# Patient Record
Sex: Female | Born: 1995 | Race: White | Hispanic: No | Marital: Single | Smoking: Never smoker
Health system: Southern US, Community
[De-identification: ages and names within clinical notes are randomized; demographics above are authoritative.]

## PROBLEM LIST (undated history)

## (undated) ENCOUNTER — Inpatient Hospital Stay (HOSPITAL_COMMUNITY): Payer: Self-pay

---
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
## 2020-06-28 ENCOUNTER — Emergency Department (HOSPITAL_COMMUNITY)
Admission: EM | Admit: 2020-06-28 | Discharge: 2020-06-29 | Disposition: A | Discharge status: Home / Self Care | Payer: Medicaid Other | Attending: Emergency Medicine | Admitting: Emergency Medicine

## 2020-06-28 ENCOUNTER — Other Ambulatory Visit: Payer: Self-pay

## 2020-06-28 ENCOUNTER — Encounter (HOSPITAL_COMMUNITY): Payer: Self-pay | Admitting: Emergency Medicine

## 2020-06-28 DIAGNOSIS — U071 COVID-19: Secondary | ICD-10-CM | POA: Insufficient documentation

## 2020-06-28 DIAGNOSIS — R059 Cough, unspecified: Secondary | ICD-10-CM | POA: Diagnosis present

## 2020-06-28 DIAGNOSIS — B349 Viral infection, unspecified: Secondary | ICD-10-CM

## 2020-06-28 DIAGNOSIS — G4483 Primary cough headache: Secondary | ICD-10-CM

## 2020-06-28 LAB — URINALYSIS, ROUTINE W REFLEX MICROSCOPIC
Bilirubin Urine: NEGATIVE
Glucose, UA: NEGATIVE mg/dL
Hgb urine dipstick: NEGATIVE
Ketones, ur: 20 mg/dL — AB
Leukocytes,Ua: NEGATIVE
Nitrite: NEGATIVE
Protein, ur: NEGATIVE mg/dL
Specific Gravity, Urine: 1.013 (ref 1.005–1.030)
pH: 6 (ref 5.0–8.0)

## 2020-06-28 NOTE — ED Provider Notes (Addendum)
Emergency Medicine Provider Triage Evaluation Note  Kristen Blankenship , a 25 y.o. female  was evaluated in triage.  Pt complains of headache, chills for the past few days.  Reports vomiting but states that this is typical as she is about [redacted] weeks pregnant.  Denies any abdominal pain, vaginal bleeding, discharge, chest pain.  Also reports back pain.  Review of Systems  Positive: Pain, headache, chills Negative: Abdominal pain, vaginal bleeding  Physical Exam  BP 99/70 (BP Location: Right Arm)   Pulse (!) 105   Temp 99.5 F (37.5 C) (Oral)   Resp 16   Ht 5\' 5"  (1.651 m)   Wt 104.3 kg   SpO2 98%   BMI 38.27 kg/m  Gen:   Awake, no distress   Resp:  Normal effort  MSK:   Moves extremities without difficulty  Other:  Abd soft  Medical Decision Making  Medically screening exam initiated at 7:32 PM.  Appropriate orders placed.  Kristen Blankenship was informed that the remainder of the evaluation will be completed by another provider, this initial triage assessment does not replace that evaluation, and the importance of remaining in the ED until their evaluation is complete.  UA and covid test ordered   Spoke to MAU provider they prefer we take care of these known pregnancy related concerns here in the ER    York Pellant, PA-C 06/28/20 2021    [place], 2022, MD 06/28/20 2036

## 2020-06-28 NOTE — ED Triage Notes (Signed)
Patient reports feeling tired , fatigue , gen. body aches , poor appetite with chills onset this week . Denies vaginal bleeding or contractions .

## 2020-06-29 LAB — RESP PANEL BY RT-PCR (FLU A&B, COVID) ARPGX2
Influenza A by PCR: NEGATIVE
Influenza B by PCR: NEGATIVE
SARS Coronavirus 2 by RT PCR: POSITIVE — AB

## 2020-06-29 MED ORDER — ACETAMINOPHEN 500 MG PO TABS
1000.0000 mg | ORAL_TABLET | Freq: Once | ORAL | Status: AC
  Administered 2020-06-29: 1000 mg via ORAL
  Filled 2020-06-29: qty 2

## 2020-06-29 MED ORDER — METOCLOPRAMIDE HCL 10 MG PO TABS: 10.0000 mg | ORAL_TABLET | Freq: Four times a day (QID) | ORAL | 0 refills | Status: AC | PRN

## 2020-06-29 MED ORDER — ACETAMINOPHEN 325 MG PO TABS: 325.0000 mg | ORAL_TABLET | Freq: Once | ORAL | Status: DC

## 2020-06-29 NOTE — Discharge Instructions (Signed)
Take tylenol 2 pills 4 times a day.  Drink plenty of fluids.  Return for worsening shortness of breath, headache, confusion. Follow up with your family doctor.     

## 2020-06-29 NOTE — ED Provider Notes (Signed)
[facility] EMERGENCY DEPARTMENT Provider Note   CSN: [id number] Arrival date & time: 06/28/20  1902     History Chief Complaint  Patient presents with  . 14 weeks G2P1: Migraine /Fatigue    Kristen Blankenship is a 25 y.o. female.  25 yo F with a chief complaints of cough congestion fevers chills myalgias nausea vomiting and a headache.  This been going on for a few days.  Patient has just 1 to lay around and has really wanted to do anything.  Not really wanting to eat or drink.  Has taken Tylenol with improvement of her symptoms but this it is the medicine wears off she feels bad again.  No known sick contacts.  No vaginal bleeding or discharge.  No abdominal pain.  She is about [redacted] weeks pregnant.  The history is provided by the patient.  Illness Severity:  Moderate Onset quality:  Gradual Duration:  3 days Timing:  Constant Progression:  Worsening Chronicity:  New Associated symptoms: congestion, cough, fever, headaches, nausea and vomiting   Associated symptoms: no abdominal pain, no chest pain, no diarrhea, no myalgias, no rhinorrhea, no shortness of breath and no wheezing        History reviewed. No pertinent past medical history.  Patient Active Problem List   Diagnosis Date Noted  . Risky sexual behavior 07/18/2013  . Substance use disorder 07/18/2013  . Depression 07/18/2013    History reviewed. No pertinent surgical history.   OB History    Gravida  2   Para  0   Term  0   Preterm  0   AB  0   Living        SAB  0   IAB  0   Ectopic  0   Multiple      Live Births              Family History  Problem Relation Age of Onset  . Cancer Other   . Hypertension Other   . Seizures Other     Social History   Tobacco Use  . Smoking status: Never Smoker  . Smokeless tobacco: Never Used  Substance Use Topics  . Alcohol use: No  . Drug use: No    Home Medications Prior to Admission medications   Medication Sig Start  Date End Date Taking? Authorizing Provider  acetaminophen (TYLENOL) 500 MG tablet Take 500-1,000 mg by mouth every 6 (six) hours as needed for mild pain or headache.   Yes [provider]  metoCLOPramide (REGLAN) 10 MG tablet Take 1 tablet (10 mg total) by mouth every 6 (six) hours as needed for nausea (nausea/headache). 06/29/20  Yes Melene Plan, DO  Prenat-Fe Poly-Methfol-FA-DHA (VITAFOL ULTRA) 29-0.6-0.4-200 MG CAPS Take 1 tablet by mouth daily.   Yes [provider]    Allergies    Patient has no known allergies.  Review of Systems   Review of Systems  Constitutional: Positive for chills and fever.  HENT: Positive for congestion. Negative for rhinorrhea.   Eyes: Negative for redness and visual disturbance.  Respiratory: Positive for cough. Negative for shortness of breath and wheezing.   Cardiovascular: Negative for chest pain and palpitations.  Gastrointestinal: Positive for nausea and vomiting. Negative for abdominal pain and diarrhea.  Genitourinary: Negative for dysuria, pelvic pain, urgency, vaginal bleeding, vaginal discharge and vaginal pain.  Musculoskeletal: Positive for back pain. Negative for arthralgias and myalgias.  Skin: Negative for pallor and wound.  Neurological: Positive  for headaches. Negative for dizziness.    Physical Exam Updated Vital Signs BP 101/74   Pulse (!) 101   Temp 100.3 F (37.9 C) (Oral)   Resp 19   Ht 5\' 5"  (1.651 m)   Wt 104.3 kg   SpO2 100%   BMI 38.27 kg/m   Physical Exam Vitals and nursing note reviewed.  Constitutional:      General: She is not in acute distress.    Appearance: She is well-developed. She is not diaphoretic.  HENT:     Head: Normocephalic and atraumatic.  Eyes:     Pupils: Pupils are equal, round, and reactive to light.  Cardiovascular:     Rate and Rhythm: Normal rate and regular rhythm.     Heart sounds: No murmur heard. No friction rub. No gallop.   Pulmonary:     Effort: Pulmonary effort  is normal.     Breath sounds: No wheezing or rales.  Abdominal:     General: There is no distension.     Palpations: Abdomen is soft.     Tenderness: There is no abdominal tenderness.  Musculoskeletal:        General: No tenderness.     Cervical back: Normal range of motion and neck supple.  Skin:    General: Skin is warm and dry.  Neurological:     Mental Status: She is alert and oriented to person, place, and time.  Psychiatric:        Behavior: Behavior normal.     ED Results / Procedures / Treatments   Labs (all labs ordered are listed, but only abnormal results are displayed) Labs Reviewed  URINALYSIS, ROUTINE W REFLEX MICROSCOPIC - Abnormal; Notable for the following components:      Result Value   Ketones, ur 20 (*)    All other components within normal limits  RESP PANEL BY RT-PCR (FLU A&B, COVID) [id number]  URINE CULTURE    EKG None  Radiology No results found.  Procedures Procedures   Medications Ordered in ED Medications - No data to display  ED Course  I have reviewed the triage vital signs and the nursing notes.  Pertinent labs & imaging results that were available during my care of the patient were reviewed by me and considered in my medical decision making (see chart for details).    MDM Rules/Calculators/A&P                          25 yo F with a chief complaints of what sounds like viral syndrome cough congestion fevers chills myalgias nausea vomiting and headache.  She is well-appearing and nontoxic.  No meningeal signs.  No neurologic deficit.  Able to tolerate by mouth.  We will have her oral hydrate at home.  This is occurring during the pandemic we will send off a COVID test.  PCP and OB follow-up.  12:38 AM:  I have discussed the diagnosis/risks/treatment options with the patient and believe the pt to be eligible for discharge home to follow-up with OB. We also discussed returning to the ED immediately if new or worsening sx occur. We  discussed the sx which are most concerning (e.g., sudden worsening pain, fever, inability to tolerate by mouth) that necessitate immediate return. Medications administered to the patient during their visit and any new prescriptions provided to the patient are listed below.  Medications given during this visit Medications - No data to display   The patient  appears reasonably screen and/or stabilized for discharge and I doubt any other medical condition or other [facility] requiring further screening, evaluation, or treatment in the ED at this time prior to discharge.   Final Clinical Impression(s) / ED Diagnoses Final diagnoses:  Viral syndrome  Headache after cough    Rx / DC Orders ED Discharge Orders         Ordered    metoCLOPramide (REGLAN) 10 MG tablet  Every 6 hours PRN        06/29/20 Jeremy Johann, DO 06/29/20 0038

## 2020-06-29 NOTE — ED Notes (Signed)
Pt discharged and wheeled out of the ED in a wheel chair without difficulty. 

## 2020-06-30 LAB — URINE CULTURE
